# Patient Record
Sex: Male | Born: 1990 | Race: Black or African American | Hispanic: No | Marital: Married
Health system: Southern US, Community
[De-identification: ages and names within clinical notes are randomized; demographics above are authoritative.]

---

## 2016-08-09 ENCOUNTER — Inpatient Hospital Stay (HOSPITAL_COMMUNITY)
Admission: EM | Admit: 2016-08-09 | Discharge: 2016-08-13 | DRG: 083 | Disposition: A | Discharge status: Rehabilitation Facility | Payer: No Typology Code available for payment source | Attending: General Surgery | Admitting: General Surgery

## 2016-08-09 ENCOUNTER — Encounter (HOSPITAL_COMMUNITY): Payer: Self-pay | Admitting: Radiology

## 2016-08-09 ENCOUNTER — Emergency Department (HOSPITAL_COMMUNITY): Payer: No Typology Code available for payment source

## 2016-08-09 DIAGNOSIS — S069X9A Unspecified intracranial injury with loss of consciousness of unspecified duration, initial encounter: Secondary | ICD-10-CM | POA: Diagnosis present

## 2016-08-09 DIAGNOSIS — Z781 Physical restraint status: Secondary | ICD-10-CM | POA: Diagnosis not present

## 2016-08-09 DIAGNOSIS — S51012A Laceration without foreign body of left elbow, initial encounter: Secondary | ICD-10-CM | POA: Diagnosis present

## 2016-08-09 DIAGNOSIS — I629 Nontraumatic intracranial hemorrhage, unspecified: Secondary | ICD-10-CM

## 2016-08-09 DIAGNOSIS — J9600 Acute respiratory failure, unspecified whether with hypoxia or hypercapnia: Secondary | ICD-10-CM | POA: Diagnosis present

## 2016-08-09 DIAGNOSIS — R45851 Suicidal ideations: Secondary | ICD-10-CM

## 2016-08-09 DIAGNOSIS — R4689 Other symptoms and signs involving appearance and behavior: Secondary | ICD-10-CM

## 2016-08-09 DIAGNOSIS — N179 Acute kidney failure, unspecified: Secondary | ICD-10-CM | POA: Diagnosis present

## 2016-08-09 DIAGNOSIS — Z23 Encounter for immunization: Secondary | ICD-10-CM

## 2016-08-09 DIAGNOSIS — R451 Restlessness and agitation: Secondary | ICD-10-CM | POA: Diagnosis present

## 2016-08-09 DIAGNOSIS — S069XAA Unspecified intracranial injury with loss of consciousness status unknown, initial encounter: Secondary | ICD-10-CM

## 2016-08-09 DIAGNOSIS — S062X9A Diffuse traumatic brain injury with loss of consciousness of unspecified duration, initial encounter: Secondary | ICD-10-CM | POA: Diagnosis present

## 2016-08-09 LAB — URINALYSIS, ROUTINE W REFLEX MICROSCOPIC
Bilirubin Urine: NEGATIVE
GLUCOSE, UA: NEGATIVE mg/dL
Hgb urine dipstick: NEGATIVE
KETONES UR: NEGATIVE mg/dL
LEUKOCYTES UA: NEGATIVE
NITRITE: NEGATIVE
PH: 6 (ref 5.0–8.0)
Protein, ur: NEGATIVE mg/dL
SPECIFIC GRAVITY, URINE: 1.015 (ref 1.005–1.030)

## 2016-08-09 LAB — TYPE AND SCREEN
Blood Product Expiration Date: 201801112359
Blood Product Expiration Date: 201801122359
ISSUE DATE / TIME: 201801081149
ISSUE DATE / TIME: 201801081149
UNIT TYPE AND RH: 9500
Unit Type and Rh: 9500

## 2016-08-09 LAB — I-STAT ARTERIAL BLOOD GAS, ED
ACID-BASE DEFICIT: 3 mmol/L — AB (ref 0.0–2.0)
BICARBONATE: 23.9 mmol/L (ref 20.0–28.0)
O2 SAT: 100 %
PCO2 ART: 47.7 mmHg (ref 32.0–48.0)
Patient temperature: 98.7
TCO2: 25 mmol/L (ref 0–100)
pH, Arterial: 7.309 — ABNORMAL LOW (ref 7.350–7.450)
pO2, Arterial: 498 mmHg — ABNORMAL HIGH (ref 83.0–108.0)

## 2016-08-09 LAB — PREPARE FRESH FROZEN PLASMA
UNIT DIVISION: 0
Unit division: 0

## 2016-08-09 LAB — CBC
HEMATOCRIT: 44.5 % (ref 39.0–52.0)
Hemoglobin: 14.9 g/dL (ref 13.0–17.0)
MCH: 24.5 pg — AB (ref 26.0–34.0)
MCHC: 33.5 g/dL (ref 30.0–36.0)
MCV: 73.2 fL — AB (ref 78.0–100.0)
Platelets: 323 10*3/uL (ref 150–400)
RBC: 6.08 MIL/uL — AB (ref 4.22–5.81)
RDW: 14.5 % (ref 11.5–15.5)
WBC: 20.4 10*3/uL — AB (ref 4.0–10.5)

## 2016-08-09 LAB — COMPREHENSIVE METABOLIC PANEL
ALT: 24 U/L (ref 17–63)
AST: 51 U/L — ABNORMAL HIGH (ref 15–41)
Albumin: 4.4 g/dL (ref 3.5–5.0)
Alkaline Phosphatase: 74 U/L (ref 38–126)
Anion gap: 17 — ABNORMAL HIGH (ref 5–15)
BUN: <5 mg/dL — ABNORMAL LOW (ref 6–20)
CHLORIDE: 105 mmol/L (ref 101–111)
CO2: 21 mmol/L — AB (ref 22–32)
CREATININE: 1.33 mg/dL — AB (ref 0.61–1.24)
Calcium: 9.8 mg/dL (ref 8.9–10.3)
Glucose, Bld: 173 mg/dL — ABNORMAL HIGH (ref 65–99)
POTASSIUM: 4 mmol/L (ref 3.5–5.1)
SODIUM: 143 mmol/L (ref 135–145)
Total Bilirubin: 1 mg/dL (ref 0.3–1.2)
Total Protein: 7.9 g/dL (ref 6.5–8.1)

## 2016-08-09 LAB — I-STAT CG4 LACTIC ACID, ED: Lactic Acid, Venous: 11.55 mmol/L (ref 0.5–1.9)

## 2016-08-09 LAB — I-STAT CHEM 8, ED
BUN: 5 mg/dL — ABNORMAL LOW (ref 6–20)
CREATININE: 1.1 mg/dL (ref 0.61–1.24)
Calcium, Ion: 1.17 mmol/L (ref 1.15–1.40)
Chloride: 103 mmol/L (ref 101–111)
GLUCOSE: 168 mg/dL — AB (ref 65–99)
HEMATOCRIT: 49 % (ref 39.0–52.0)
HEMOGLOBIN: 16.7 g/dL (ref 13.0–17.0)
POTASSIUM: 4.3 mmol/L (ref 3.5–5.1)
Sodium: 143 mmol/L (ref 135–145)
TCO2: 25 mmol/L (ref 0–100)

## 2016-08-09 LAB — PROTIME-INR
INR: 1.07
PROTHROMBIN TIME: 14 s (ref 11.4–15.2)

## 2016-08-09 LAB — ABO/RH: ABO/RH(D): B NEG

## 2016-08-09 LAB — CDS SEROLOGY

## 2016-08-09 LAB — ETHANOL: Alcohol, Ethyl (B): <5 mg/dL (ref <5)

## 2016-08-09 MED ORDER — POTASSIUM CHLORIDE IN NACL 20-0.9 MEQ/L-% IV SOLN
INTRAVENOUS | Status: DC
  Administered 2016-08-09 – 2016-08-12 (×4): via INTRAVENOUS
  Filled 2016-08-09 (×6): qty 1000

## 2016-08-09 MED ORDER — PROPOFOL 1000 MG/100ML IV EMUL
INTRAVENOUS | Status: AC
  Administered 2016-08-09: 70 ug/kg/min
  Administered 2016-08-09: 20:00:00
  Filled 2016-08-09: qty 100

## 2016-08-09 MED ORDER — FENTANYL BOLUS VIA INFUSION
50.0000 ug | INTRAVENOUS | Status: DC | PRN
  Filled 2016-08-09: qty 50

## 2016-08-09 MED ORDER — BACITRACIN ZINC 500 UNIT/GM EX OINT
TOPICAL_OINTMENT | Freq: Two times a day (BID) | CUTANEOUS | Status: DC
  Administered 2016-08-09 – 2016-08-13 (×8): via TOPICAL
  Filled 2016-08-09: qty 28.35

## 2016-08-09 MED ORDER — SODIUM CHLORIDE 0.9 % IV SOLN
INTRAVENOUS | Status: AC | PRN
  Administered 2016-08-09: 1000 mL via INTRAVENOUS

## 2016-08-09 MED ORDER — SODIUM CHLORIDE 0.9 % IV SOLN
25.0000 ug/h | INTRAVENOUS | Status: DC
  Administered 2016-08-09: 100 ug/h via INTRAVENOUS
  Filled 2016-08-09: qty 50

## 2016-08-09 MED ORDER — CHLORHEXIDINE GLUCONATE 0.12% ORAL RINSE (MEDLINE KIT)
15.0000 mL | Freq: Two times a day (BID) | OROMUCOSAL | Status: DC
  Administered 2016-08-09 – 2016-08-12 (×6): 15 mL via OROMUCOSAL

## 2016-08-09 MED ORDER — ONDANSETRON HCL 4 MG/2ML IJ SOLN
4.0000 mg | Freq: Four times a day (QID) | INTRAMUSCULAR | Status: DC | PRN
  Administered 2016-08-10 – 2016-08-12 (×3): 4 mg via INTRAVENOUS
  Filled 2016-08-09 (×4): qty 2

## 2016-08-09 MED ORDER — HALOPERIDOL LACTATE 5 MG/ML IJ SOLN
5.0000 mg | Freq: Once | INTRAMUSCULAR | Status: AC
  Administered 2016-08-09: 5 mg via INTRAVENOUS

## 2016-08-09 MED ORDER — MIDAZOLAM HCL 2 MG/2ML IJ SOLN
2.0000 mg | INTRAMUSCULAR | Status: DC | PRN
Start: 2016-08-09 — End: 2016-08-13
  Administered 2016-08-10 – 2016-08-12 (×9): 2 mg via INTRAVENOUS
  Filled 2016-08-09 (×8): qty 2

## 2016-08-09 MED ORDER — ORAL CARE MOUTH RINSE
15.0000 mL | Freq: Four times a day (QID) | OROMUCOSAL | Status: DC
  Administered 2016-08-09 – 2016-08-12 (×8): 15 mL via OROMUCOSAL

## 2016-08-09 MED ORDER — LIDOCAINE-EPINEPHRINE (PF) 2 %-1:200000 IJ SOLN
10.0000 mL | Freq: Once | INTRAMUSCULAR | Status: AC
  Administered 2016-08-09: 10 mL via INTRADERMAL
  Filled 2016-08-09: qty 20

## 2016-08-09 MED ORDER — PROPOFOL 1000 MG/100ML IV EMUL
INTRAVENOUS | Status: AC
  Filled 2016-08-09: qty 100

## 2016-08-09 MED ORDER — FENTANYL CITRATE (PF) 100 MCG/2ML IJ SOLN
50.0000 ug | Freq: Once | INTRAMUSCULAR | Status: AC
  Administered 2016-08-09: 50 ug via INTRAVENOUS

## 2016-08-09 MED ORDER — PANTOPRAZOLE SODIUM 40 MG PO TBEC
40.0000 mg | DELAYED_RELEASE_TABLET | Freq: Every day | ORAL | Status: DC
  Administered 2016-08-13: 40 mg via ORAL
  Filled 2016-08-09: qty 1

## 2016-08-09 MED ORDER — PANTOPRAZOLE SODIUM 40 MG IV SOLR
40.0000 mg | Freq: Every day | INTRAVENOUS | Status: DC
  Administered 2016-08-09 – 2016-08-12 (×4): 40 mg via INTRAVENOUS
  Filled 2016-08-09 (×4): qty 40

## 2016-08-09 MED ORDER — DOCUSATE SODIUM 100 MG PO CAPS
100.0000 mg | ORAL_CAPSULE | Freq: Two times a day (BID) | ORAL | Status: DC
  Administered 2016-08-12: 100 mg via ORAL
  Filled 2016-08-09: qty 1

## 2016-08-09 MED ORDER — ETOMIDATE 2 MG/ML IV SOLN
INTRAVENOUS | Status: AC | PRN
  Administered 2016-08-09: 20 mg via INTRAVENOUS

## 2016-08-09 MED ORDER — FENTANYL CITRATE (PF) 100 MCG/2ML IJ SOLN
INTRAMUSCULAR | Status: AC
  Filled 2016-08-09: qty 2

## 2016-08-09 MED ORDER — PROPOFOL 1000 MG/100ML IV EMUL
5.0000 ug/kg/min | INTRAVENOUS | Status: DC
  Administered 2016-08-09: 50 ug/kg/min via INTRAVENOUS
  Administered 2016-08-10 (×2): 45 ug/kg/min via INTRAVENOUS
  Filled 2016-08-09 (×2): qty 100
  Filled 2016-08-09: qty 200

## 2016-08-09 MED ORDER — ROCURONIUM BROMIDE 50 MG/5ML IV SOLN
INTRAVENOUS | Status: AC | PRN
  Administered 2016-08-09: 50 mg via INTRAVENOUS

## 2016-08-09 MED ORDER — ONDANSETRON HCL 4 MG PO TABS: 4.0000 mg | ORAL_TABLET | Freq: Four times a day (QID) | ORAL | Status: DC | PRN

## 2016-08-09 MED ORDER — SUCCINYLCHOLINE CHLORIDE 20 MG/ML IJ SOLN
INTRAMUSCULAR | Status: AC | PRN
  Administered 2016-08-09: 100 mg via INTRAVENOUS

## 2016-08-09 MED ORDER — MIDAZOLAM HCL 2 MG/2ML IJ SOLN
2.0000 mg | INTRAMUSCULAR | Status: DC | PRN
  Administered 2016-08-09: 2 mg via INTRAVENOUS
  Filled 2016-08-09 (×2): qty 2

## 2016-08-09 MED ORDER — PROPOFOL 1000 MG/100ML IV EMUL: INTRAVENOUS | Status: AC | PRN

## 2016-08-09 MED ORDER — IOPAMIDOL (ISOVUE-300) INJECTION 61%
100.0000 mL | Freq: Once | INTRAVENOUS | Status: AC | PRN
Start: 2016-08-09 — End: 2016-08-09
  Administered 2016-08-09: 100 mL via INTRAVENOUS

## 2016-08-09 MED ORDER — LORAZEPAM 2 MG/ML IJ SOLN
INTRAMUSCULAR | Status: AC | PRN
  Administered 2016-08-09: 2 mg via INTRAVENOUS

## 2016-08-09 NOTE — ED Notes (Signed)
Pt remains sedated.  No change in mental status, skin warm and dry, color appropriate

## 2016-08-09 NOTE — ED Notes (Signed)
Family remains at bedside.  Skin warm and dry, color appropriate,.  Pt remains sedated,.

## 2016-08-09 NOTE — ED Notes (Signed)
Family updated as to patient's status.

## 2016-08-09 NOTE — ED Triage Notes (Signed)
See trauma notes

## 2016-08-09 NOTE — Procedures (Signed)
Procedure: Laceration repair left elbow ~3cm  Indication: Laceration left elbow  Surgeon: Gerald DexterLogan Scherer, PA-S  Assist: Charma IgoMichael England Greb, PA-C  Anesthesia: Lidocaine 2% w/epinephrine  EBL: None  Complications: None  Findings: Consent was unobtainable as pt was intubated and family was not present. The area was infiltrated with ~125ml of anesthetic then several 4-0 nylon interrupted sutures were used to approximate the wound. There were irregular wound edges and some devitalized tissue which were sharply debrided. Closure was acceptable given the degree of tissue loss.    Freeman CaldronMichael J. Numair Masden, PA-C Pager: [phone] General Trauma PA Pager: [phone]

## 2016-08-09 NOTE — ED Notes (Signed)
Family remains at bedside.  Pt remains sedated.  Vital signs stable, skin warm and dry.

## 2016-08-09 NOTE — ED Notes (Signed)
Pt transported to CT with RN
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
## 2016-08-09 NOTE — Progress Notes (Signed)
RT transported patient from ED to 3M without any complications.  
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
## 2016-08-09 NOTE — ED Notes (Signed)
New bottle Propofol started at 6170mcg/kg/min

## 2016-08-09 NOTE — ED Notes (Signed)
Pt's father remains at bedside.  Father's cell number [phone]
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
## 2016-08-09 NOTE — ED Notes (Signed)
Pt returned from CT °

## 2016-08-09 NOTE — ED Notes (Signed)
Family at bedside at this time

## 2016-08-09 NOTE — ED Notes (Signed)
Pt remains the same at this time.

## 2016-08-09 NOTE — ED Notes (Signed)
Condom cath applied to pt. 

## 2016-08-10 ENCOUNTER — Inpatient Hospital Stay (HOSPITAL_COMMUNITY): Payer: No Typology Code available for payment source

## 2016-08-10 LAB — BASIC METABOLIC PANEL
ANION GAP: 7 (ref 5–15)
BUN: 5 mg/dL — AB (ref 6–20)
CHLORIDE: 111 mmol/L (ref 101–111)
CO2: 24 mmol/L (ref 22–32)
Calcium: 9 mg/dL (ref 8.9–10.3)
Creatinine, Ser: 1.19 mg/dL (ref 0.61–1.24)
GFR calc Af Amer: >60 mL/min (ref >60)
GLUCOSE: 96 mg/dL (ref 65–99)
POTASSIUM: 3.3 mmol/L — AB (ref 3.5–5.1)
Sodium: 142 mmol/L (ref 135–145)

## 2016-08-10 LAB — CBC
HEMATOCRIT: 38.9 % — AB (ref 39.0–52.0)
Hemoglobin: 12.9 g/dL — ABNORMAL LOW (ref 13.0–17.0)
MCH: 23.8 pg — AB (ref 26.0–34.0)
MCHC: 33.2 g/dL (ref 30.0–36.0)
MCV: 71.9 fL — ABNORMAL LOW (ref 78.0–100.0)
Platelets: 239 10*3/uL (ref 150–400)
RBC: 5.41 MIL/uL (ref 4.22–5.81)
RDW: 14.5 % (ref 11.5–15.5)
WBC: 12.9 10*3/uL — ABNORMAL HIGH (ref 4.0–10.5)

## 2016-08-10 LAB — MRSA PCR SCREENING: MRSA by PCR: NEGATIVE

## 2016-08-10 LAB — TRIGLYCERIDES: Triglycerides: 115 mg/dL (ref <150)

## 2016-08-10 MED ORDER — PNEUMOCOCCAL VAC POLYVALENT 25 MCG/0.5ML IJ INJ
0.5000 mL | INJECTION | INTRAMUSCULAR | Status: AC
  Administered 2016-08-11: 0.5 mL via INTRAMUSCULAR
  Filled 2016-08-10: qty 0.5

## 2016-08-10 MED ORDER — MIDAZOLAM HCL 2 MG/2ML IJ SOLN: 1.0000 mg | INTRAMUSCULAR | Status: DC | PRN

## 2016-08-10 MED ORDER — LORAZEPAM 2 MG/ML IJ SOLN
1.0000 mg | INTRAMUSCULAR | Status: DC | PRN
  Administered 2016-08-10 – 2016-08-11 (×5): 2 mg via INTRAVENOUS
  Filled 2016-08-10 (×6): qty 1

## 2016-08-10 MED ORDER — LORAZEPAM 2 MG/ML IJ SOLN
INTRAMUSCULAR | Status: AC
  Administered 2016-08-10: 2 mg
  Filled 2016-08-10: qty 1

## 2016-08-10 MED ORDER — DEXMEDETOMIDINE HCL IN NACL 200 MCG/50ML IV SOLN
0.4000 ug/kg/h | INTRAVENOUS | Status: DC
  Administered 2016-08-10: 1.1 ug/kg/h via INTRAVENOUS
  Administered 2016-08-10: 1.2 ug/kg/h via INTRAVENOUS
  Administered 2016-08-10: 0.4 ug/kg/h via INTRAVENOUS
  Administered 2016-08-10: 1.2 ug/kg/h via INTRAVENOUS
  Administered 2016-08-11: 0.6 ug/kg/h via INTRAVENOUS
  Administered 2016-08-11 (×2): 0.8 ug/kg/h via INTRAVENOUS
  Administered 2016-08-11: 0.7 ug/kg/h via INTRAVENOUS
  Administered 2016-08-11: 0.5 ug/kg/h via INTRAVENOUS
  Administered 2016-08-12: 0.6 ug/kg/h via INTRAVENOUS
  Filled 2016-08-10 (×7): qty 50
  Filled 2016-08-10: qty 100

## 2016-08-10 MED ORDER — INFLUENZA VAC SPLIT QUAD 0.5 ML IM SUSY
0.5000 mL | PREFILLED_SYRINGE | INTRAMUSCULAR | Status: AC
Start: 2016-08-11 — End: 2016-08-13
  Administered 2016-08-13: 0.5 mL via INTRAMUSCULAR
  Filled 2016-08-10 (×2): qty 0.5

## 2016-08-10 MED ORDER — HALOPERIDOL LACTATE 5 MG/ML IJ SOLN
5.0000 mg | Freq: Four times a day (QID) | INTRAMUSCULAR | Status: DC | PRN
  Administered 2016-08-11: 5 mg via INTRAVENOUS
  Filled 2016-08-10: qty 1

## 2016-08-10 MED ORDER — SODIUM CHLORIDE 0.9 % IV SOLN
30.0000 meq | Freq: Once | INTRAVENOUS | Status: AC
  Administered 2016-08-10: 30 meq via INTRAVENOUS
  Filled 2016-08-10: qty 15
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
## 2016-08-10 NOTE — Progress Notes (Signed)
Patient ID: Luke Evans, male   DOB: 10/22/1990, 26 y.o.   MRN: [id number] Extubated. Sats OK but agitated. WIll try Precedex. Family updated at bedside. [name], MD, MPH, FACS Trauma: [phone] General Surgery: [phone]

## 2016-08-10 NOTE — Procedures (Signed)
Extubation Procedure Note  Patient Details:   Name: Luke Evans DOB: 02/04/1991 MRN: [id number]   Airway Documentation:     Evaluation  O2 sats: stable throughout Complications: No apparent complications Patient did tolerate procedure well. Bilateral Breath Sounds: Clear   Yes  Patient very anxious. Pulling good volumes. MD at bedside. Order to extubate. Positive for cuff leak. Patient extubated to room air as he wouldn't let the nasal cannula be put on. He became very frustrated. No signs of dyspnea or stridor. RN at bedside. Will continue to monitor.  Luke Evans, Luke Evans 08/10/2016, 3:51 PM
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
## 2016-08-11 DIAGNOSIS — T1491XA Suicide attempt, initial encounter: Secondary | ICD-10-CM

## 2016-08-11 DIAGNOSIS — S069X0A Unspecified intracranial injury without loss of consciousness, initial encounter: Secondary | ICD-10-CM

## 2016-08-11 DIAGNOSIS — R451 Restlessness and agitation: Secondary | ICD-10-CM

## 2016-08-11 DIAGNOSIS — X820XXA Intentional collision of motor vehicle with other motor vehicle, initial encounter: Secondary | ICD-10-CM

## 2016-08-11 DIAGNOSIS — Z79899 Other long term (current) drug therapy: Secondary | ICD-10-CM

## 2016-08-11 DIAGNOSIS — Z630 Problems in relationship with spouse or partner: Secondary | ICD-10-CM

## 2016-08-11 DIAGNOSIS — F1910 Other psychoactive substance abuse, uncomplicated: Secondary | ICD-10-CM

## 2016-08-11 LAB — CBC
HEMATOCRIT: 38.7 % — AB (ref 39.0–52.0)
HEMOGLOBIN: 12.7 g/dL — AB (ref 13.0–17.0)
MCH: 23.9 pg — AB (ref 26.0–34.0)
MCHC: 32.8 g/dL (ref 30.0–36.0)
MCV: 72.9 fL — AB (ref 78.0–100.0)
Platelets: 214 10*3/uL (ref 150–400)
RBC: 5.31 MIL/uL (ref 4.22–5.81)
RDW: 14.5 % (ref 11.5–15.5)
WBC: 13.1 10*3/uL — ABNORMAL HIGH (ref 4.0–10.5)

## 2016-08-11 LAB — BASIC METABOLIC PANEL
Anion gap: 8 (ref 5–15)
BUN: 7 mg/dL (ref 6–20)
CHLORIDE: 110 mmol/L (ref 101–111)
CO2: 22 mmol/L (ref 22–32)
Calcium: 9 mg/dL (ref 8.9–10.3)
Creatinine, Ser: 0.91 mg/dL (ref 0.61–1.24)
GFR calc Af Amer: >60 mL/min (ref >60)
GFR calc non Af Amer: >60 mL/min (ref >60)
GLUCOSE: 109 mg/dL — AB (ref 65–99)
POTASSIUM: 3.7 mmol/L (ref 3.5–5.1)
Sodium: 140 mmol/L (ref 135–145)

## 2016-08-11 LAB — BLOOD PRODUCT ORDER (VERBAL) VERIFICATION

## 2016-08-11 MED ORDER — HYDRALAZINE HCL 20 MG/ML IJ SOLN: 10.0000 mg | INTRAMUSCULAR | Status: DC | PRN
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
## 2016-08-11 NOTE — Progress Notes (Signed)
Inpatient Rehabilitation  PT/OT/SLP have completed their evaluations and are recommending CIR. Patient was screened by Weldon PickingSusan Anay Walter for appropriateness for an Inpatient Acute Rehab consult.  At this time, we are recommending Inpatient Rehab consult.  Please order consult if you are agreeable.  Weldon PickingSusan Linden Mikes PT Inpatient Rehab Admissions Coordinator Cell [phone] Office [phone]
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
## 2016-08-12 DIAGNOSIS — S069X3S Unspecified intracranial injury with loss of consciousness of 1 hour to 5 hours 59 minutes, sequela: Secondary | ICD-10-CM

## 2016-08-12 LAB — CBC
HCT: 35.9 % — ABNORMAL LOW (ref 39.0–52.0)
HEMOGLOBIN: 11.5 g/dL — AB (ref 13.0–17.0)
MCH: 23 pg — ABNORMAL LOW (ref 26.0–34.0)
MCHC: 32 g/dL (ref 30.0–36.0)
MCV: 71.7 fL — ABNORMAL LOW (ref 78.0–100.0)
Platelets: 203 10*3/uL (ref 150–400)
RBC: 5.01 MIL/uL (ref 4.22–5.81)
RDW: 14 % (ref 11.5–15.5)
WBC: 10.3 10*3/uL (ref 4.0–10.5)

## 2016-08-12 LAB — BASIC METABOLIC PANEL
ANION GAP: 7 (ref 5–15)
BUN: <5 mg/dL — ABNORMAL LOW (ref 6–20)
CALCIUM: 8.9 mg/dL (ref 8.9–10.3)
CO2: 24 mmol/L (ref 22–32)
Chloride: 108 mmol/L (ref 101–111)
Creatinine, Ser: 0.83 mg/dL (ref 0.61–1.24)
GFR calc non Af Amer: >60 mL/min (ref >60)
Glucose, Bld: 99 mg/dL (ref 65–99)
Potassium: 3.3 mmol/L — ABNORMAL LOW (ref 3.5–5.1)
SODIUM: 139 mmol/L (ref 135–145)

## 2016-08-12 LAB — TRIGLYCERIDES: TRIGLYCERIDES: 90 mg/dL (ref <150)

## 2016-08-12 MED ORDER — OXYCODONE HCL 5 MG PO TABS
5.0000 mg | ORAL_TABLET | ORAL | Status: DC | PRN
  Administered 2016-08-12: 5 mg via ORAL
  Administered 2016-08-13 (×2): 10 mg via ORAL
  Filled 2016-08-12 (×2): qty 2
  Filled 2016-08-12: qty 1

## 2016-08-12 MED ORDER — WHITE PETROLATUM GEL
Status: AC
  Administered 2016-08-12: 04:00:00
  Filled 2016-08-12: qty 1

## 2016-08-12 MED ORDER — POTASSIUM CHLORIDE CRYS ER 20 MEQ PO TBCR
20.0000 meq | EXTENDED_RELEASE_TABLET | Freq: Two times a day (BID) | ORAL | Status: AC
  Administered 2016-08-12 (×2): 20 meq via ORAL
  Filled 2016-08-12 (×2): qty 1

## 2016-08-12 NOTE — Discharge Instructions (Signed)
Intracranial Hemorrhage An intracranial hemorrhage is bleeding in the layers between the skull (cranium) and brain. A blood vessel bursts and allows blood to leak inside the cranial cavity. The leaking blood then collects (hematoma). This causes pressure and damage to brain cells. The bleeding can be mild to severe. In severe cases, it can lead to permanent damage or death. Symptoms may come on suddenly or develop over time. Early diagnosis and treatment leads to better recovery. There are four types of intracranial hemorrhage: subarachnoid, subdural, extradural, or cerebral hemorrhage. What are the causes?  Head injury (trauma).  Ruptured brain aneurysm.  Bleeding from blood vessels that develop abnormally (arteriovenous malformation).  Bleeding disorder.  Use of blood thinners (anticoagulants).  Use of certain drugs, such as cocaine. For some people with intracranial hemorrhage, the cause is unknown. What increases the risk?  Using tobacco products, such as cigarettes and chewing tobacco.  Having high blood pressure (hypertension).  Abusing alcohol.  Being a male, especially of postmenopausal age.  Having a family history of disease in the blood vessels of the brain (cerebrovascular disease).  Having certain genetic syndromes that result in kidney disease or connective tissue disease. What are the signs or symptoms?  A sudden, severe headache with no known cause. The headache is often described as the worst headache ever experienced.  Nausea or vomiting, especially when combined with other symptoms such as a headache.  Sudden weakness or numbness of the face, arm, or leg, especially on one side of the body.  Sudden trouble walking or difficulty moving arms or legs.  Sudden confusion.  Sudden personality changes.  Trouble speaking (aphasia) or understanding.  Difficulty swallowing.  Sudden trouble seeing in one or both eyes.  Double  vision.  Dizziness.  Loss of balance or coordination.  Intolerance to light.  Stiff neck. How is this diagnosed? Your health care provider will perform a physical exam and ask about your symptoms. If an intracranial hemorrhage is suspected, various tests may be ordered. These tests may include:  A CT scan.  An MRI.  A cerebral angiogram.  A spinal tap (lumbar puncture).  Blood tests. How is this treated? Immediate treatment in the hospital is often required to reduce the risk of brain damage. Treatment will depend on the cause of the bleeding, where it is located, and the extent of the bleeding and damage. The goals of treatment include stopping the bleeding, repairing the cause of bleeding, providing relief of symptoms, and preventing problems.  Medicines may be given to:  Lower blood pressure (antihypertensives).  Relieve pain (analgesics).  Relieve nausea or vomiting.  Surgery may be needed to stop the bleeding, repair the cause of the bleeding, or remove the blood.  Rehabilitation may be needed to improve any cognitive and day-to-day functions impaired by the condition. Further treatment depends on the duration, severity, and cause of your symptoms. Physical, speech, and occupational therapists will assess you and work to improve any functions impaired by the intracranial hemorrhage. Measures will be taken to prevent short-term and long-term problems, including infection from breathing foreign material into the lungs (aspiration pneumonia), blood clots in the legs, bedsores, and falls. Follow these instructions at home:  Take medicines only as directed by your health care provider.  Eat healthy foods as directed by your health care provider:  A diet low in salt (sodium), saturated fat, trans fat, and cholesterol may be recommended to manage your blood pressure.  Foods may need to be soft or pureed, or  small bites may need to be taken in order to avoid aspirating or  choking.  If studies show that your ability to swallow safely has been affected, you may need to seek help from specialists such as a dietitian, speech and language pathologist, or an occupational therapist. These health care providers can teach you how to safely get the nutrition your body needs.  Rest and limit activities or movements as directed by your health care provider.  Do not use any tobacco products including cigarettes, chewing tobacco, or electronic cigarettes. If you need help quitting, ask your health care provider.  Limit alcohol intake to no more than 1 drink per day for nonpregnant women and 2 drinks per day for men. One drink equals 12 ounces of beer, 5 ounces of wine, or 1 ounces of hard liquor.  Make any other lifestyle changes as directed by your health care provider.  Monitor and record your blood pressure as directed by your health care provider.  A safe home environment is important to reduce the risk of falls. Your health care provider may arrange for specialists to evaluate your home. Having grab bars in the bedroom and bathroom is often important. Your health care provider may arrange for special equipment to be used at home, such as raised toilets and a seat for the shower.  Do physical, occupational, and speech therapy as directed by your health care provider. Ongoing therapy may be needed to maximize your recovery.  Use a walker or a cane at all times if directed by your health care provider.  Keep all follow-up visits with your health care provider and other specialists. This is important. This includes any referrals, physical therapy, and rehabilitation. Get help right away if:  You have a sudden, severe headache with no known cause.  You have nausea or vomiting occurring with another symptom.  You have sudden weakness or numbness of the face, arm, or leg, especially on one side of the body.  You have sudden trouble walking or difficulty moving your arms  or legs.  You have sudden confusion.  You have trouble speaking (aphasia) or understanding.  You have sudden trouble seeing in one or both eyes.  You have a sudden loss of balance or coordination.  You have a stiff neck.  You have difficulty breathing.  You have a partial or total loss of consciousness. These symptoms may represent a serious problem that is an emergency. Do not wait to see if the symptoms will go away. Get medical help right away. Call your local emergency services (911 in the [place] Do not drive yourself to the hospital.  This information is not intended to replace advice given to you by your health care provider. Make sure you discuss any questions you have with your health care provider. Document Released: 02/13/2014 Document Revised: 12/19/2015 Document Reviewed: 09/12/2013 Elsevier Interactive Patient Education  2017 [employer] Intracranial Hemorrhage An intracranial hemorrhage is bleeding in the layers between the skull (cranium) and brain. A blood vessel bursts and allows blood to leak inside the cranial cavity. The leaking blood then collects (hematoma). This causes pressure and damage to brain cells. The bleeding can be mild to severe. In severe cases, it can lead to permanent damage or death. Symptoms may come on suddenly or develop over time. Early diagnosis and treatment leads to better recovery. There are four types of intracranial hemorrhage: subarachnoid, subdural, extradural, or cerebral hemorrhage. What are the causes?  Head injury (trauma).  Ruptured brain aneurysm.  Bleeding from blood vessels that develop abnormally (arteriovenous malformation).  Bleeding disorder.  Use of blood thinners (anticoagulants).  Use of certain drugs, such as cocaine. For some people with intracranial hemorrhage, the cause is unknown. What increases the risk?  Using tobacco products, such as cigarettes and chewing tobacco.  Having high blood pressure  (hypertension).  Abusing alcohol.  Being a male, especially of postmenopausal age.  Having a family history of disease in the blood vessels of the brain (cerebrovascular disease).  Having certain genetic syndromes that result in kidney disease or connective tissue disease. What are the signs or symptoms?  A sudden, severe headache with no known cause. The headache is often described as the worst headache ever experienced.  Nausea or vomiting, especially when combined with other symptoms such as a headache.  Sudden weakness or numbness of the face, arm, or leg, especially on one side of the body.  Sudden trouble walking or difficulty moving arms or legs.  Sudden confusion.  Sudden personality changes.  Trouble speaking (aphasia) or understanding.  Difficulty swallowing.  Sudden trouble seeing in one or both eyes.  Double vision.  Dizziness.  Loss of balance or coordination.  Intolerance to light.  Stiff neck. How is this diagnosed? Your health care provider will perform a physical exam and ask about your symptoms. If an intracranial hemorrhage is suspected, various tests may be ordered. These tests may include:  A CT scan.  An MRI.  A cerebral angiogram.  A spinal tap (lumbar puncture).  Blood tests. How is this treated? Immediate treatment in the hospital is often required to reduce the risk of brain damage. Treatment will depend on the cause of the bleeding, where it is located, and the extent of the bleeding and damage. The goals of treatment include stopping the bleeding, repairing the cause of bleeding, providing relief of symptoms, and preventing problems.  Medicines may be given to:  Lower blood pressure (antihypertensives).  Relieve pain (analgesics).  Relieve nausea or vomiting.  Surgery may be needed to stop the bleeding, repair the cause of the bleeding, or remove the blood.  Rehabilitation may be needed to improve any cognitive and  day-to-day functions impaired by the condition. Further treatment depends on the duration, severity, and cause of your symptoms. Physical, speech, and occupational therapists will assess you and work to improve any functions impaired by the intracranial hemorrhage. Measures will be taken to prevent short-term and long-term problems, including infection from breathing foreign material into the lungs (aspiration pneumonia), blood clots in the legs, bedsores, and falls. Follow these instructions at home:  Take medicines only as directed by your health care provider.  Eat healthy foods as directed by your health care provider:  A diet low in salt (sodium), saturated fat, trans fat, and cholesterol may be recommended to manage your blood pressure.  Foods may need to be soft or pureed, or small bites may need to be taken in order to avoid aspirating or choking.  If studies show that your ability to swallow safely has been affected, you may need to seek help from specialists such as a dietitian, speech and language pathologist, or an occupational therapist. These health care providers can teach you how to safely get the nutrition your body needs.  Rest and limit activities or movements as directed by your health care provider.  Do not use any tobacco products including cigarettes, chewing tobacco, or electronic cigarettes. If you need help quitting, ask your health care provider.  Limit alcohol  intake to no more than 1 drink per day for nonpregnant women and 2 drinks per day for men. One drink equals 12 ounces of beer, 5 ounces of wine, or 1 ounces of hard liquor.  Make any other lifestyle changes as directed by your health care provider.  Monitor and record your blood pressure as directed by your health care provider.  A safe home environment is important to reduce the risk of falls. Your health care provider may arrange for specialists to evaluate your home. Having grab bars in the bedroom and  bathroom is often important. Your health care provider may arrange for special equipment to be used at home, such as raised toilets and a seat for the shower.  Do physical, occupational, and speech therapy as directed by your health care provider. Ongoing therapy may be needed to maximize your recovery.  Use a walker or a cane at all times if directed by your health care provider.  Keep all follow-up visits with your health care provider and other specialists. This is important. This includes any referrals, physical therapy, and rehabilitation. Get help right away if:  You have a sudden, severe headache with no known cause.  You have nausea or vomiting occurring with another symptom.  You have sudden weakness or numbness of the face, arm, or leg, especially on one side of the body.  You have sudden trouble walking or difficulty moving your arms or legs.  You have sudden confusion.  You have trouble speaking (aphasia) or understanding.  You have sudden trouble seeing in one or both eyes.  You have a sudden loss of balance or coordination.  You have a stiff neck.  You have difficulty breathing.  You have a partial or total loss of consciousness. These symptoms may represent a serious problem that is an emergency. Do not wait to see if the symptoms will go away. Get medical help right away. Call your local emergency services (911 in the [place] Do not drive yourself to the hospital.  This information is not intended to replace advice given to you by your health care provider. Make sure you discuss any questions you have with your health care provider. Document Released: 02/13/2014 Document Revised: 12/19/2015 Document Reviewed: 09/12/2013 Elsevier Interactive Patient Education  2017 [employer]
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
## 2016-08-12 NOTE — Progress Notes (Signed)
Rehab admissions - I met with mom/family at the bedside while patient asleep.  Mom wants inpatient rehab here at [facility]  Per attending MD, will wait to see how patient does today and then plan for inpatient rehab tomorrow if patient stable over night.  I will follow up in am.  Call me for questions.  #[phone]
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
## 2016-08-12 NOTE — Progress Notes (Signed)
Encountered pt's mom Aram BeechamCynthia in hall on way to see pt and exchanged words of encouragement and blessing with her there,and w/ healthcare providers and pt's aunt in rm -- where aunt said from bathrm pt also told her to tell me hello. Promised to pray for all, esp. continuing recovery for pt whom Aram BeechamCynthia said by next Tuesday (the first day I might see them again) might be in rehab. Advised her in meantime to ask nurse to page chaplain if desired.    08/12/16 1700  Clinical Encounter Type  Visited With Family;Patient and family together  Visit Type Follow-up;Psychological support;Spiritual support;Social support  Referral From Chaplain  Spiritual Encounters  Spiritual Needs Emotional  Stress Factors  Patient Stress Factors Health changes;Loss of control  Family Stress Factors Family relationships;Health changes;Loss of control   Ephraim Hamburgerynthia A Francile Woolford, [address]

## 2016-08-12 NOTE — Progress Notes (Signed)
Pt's wife called and after giving me password, asked, "what is process for getting him tranferred somewhere else, bc I am moving."  Explained to wife that pt is doing well, and will be looking to go to rehab post dc in next day or two.  Wife replied with, "oh, ok.  Nevermind".  Pt's mother aware of wife's call and states, "before anyone tries to get him transferred anywhere, I want to make sure that she shows proof of marriage".  Notified case Production designer, theatre/television/filmmanager, Raynelle FanningJulie.

## 2016-08-12 NOTE — Progress Notes (Signed)
Stopped by to visit w/ family in rm: pt's aunt and dad. Provided emotional/spiritual support and prayer (in which nursing tech joined in). Pt's mom came in at end of visit and seemed to be holding up ok. Chaplain available for f/u.    08/11/16 1600  Clinical Encounter Type  Visited With Patient and family together;Health care provider  Visit Type Follow-up;Psychological support;Spiritual support;Social support;Critical Care  Referral From Chaplain  Spiritual Encounters  Spiritual Needs Prayer;Emotional  Stress Factors  Patient Stress Factors Health changes;Loss of control  Family Stress Factors Family relationships;Health changes;Loss of control   Luke Evans, [address]

## 2016-08-13 ENCOUNTER — Inpatient Hospital Stay (HOSPITAL_COMMUNITY)
Admission: RE | Admit: 2016-08-13 | Discharge: 2016-08-17 | DRG: 949 | Disposition: A | Discharge status: Home / Self Care | Payer: No Typology Code available for payment source | Source: Intra-hospital | Attending: Physical Medicine & Rehabilitation | Admitting: Physical Medicine & Rehabilitation

## 2016-08-13 DIAGNOSIS — D62 Acute posthemorrhagic anemia: Secondary | ICD-10-CM

## 2016-08-13 DIAGNOSIS — Z8659 Personal history of other mental and behavioral disorders: Secondary | ICD-10-CM

## 2016-08-13 DIAGNOSIS — K5900 Constipation, unspecified: Secondary | ICD-10-CM | POA: Diagnosis not present

## 2016-08-13 DIAGNOSIS — J9600 Acute respiratory failure, unspecified whether with hypoxia or hypercapnia: Secondary | ICD-10-CM | POA: Diagnosis present

## 2016-08-13 DIAGNOSIS — R45851 Suicidal ideations: Secondary | ICD-10-CM

## 2016-08-13 DIAGNOSIS — S069X9A Unspecified intracranial injury with loss of consciousness of unspecified duration, initial encounter: Secondary | ICD-10-CM | POA: Diagnosis present

## 2016-08-13 DIAGNOSIS — F918 Other conduct disorders: Secondary | ICD-10-CM

## 2016-08-13 DIAGNOSIS — S062XAA Diffuse traumatic brain injury with loss of consciousness status unknown, initial encounter: Secondary | ICD-10-CM

## 2016-08-13 DIAGNOSIS — S06300D Unspecified focal traumatic brain injury without loss of consciousness, subsequent encounter: Secondary | ICD-10-CM | POA: Diagnosis present

## 2016-08-13 DIAGNOSIS — S062X9A Diffuse traumatic brain injury with loss of consciousness of unspecified duration, initial encounter: Secondary | ICD-10-CM

## 2016-08-13 DIAGNOSIS — S069XAA Unspecified intracranial injury with loss of consciousness status unknown, initial encounter: Secondary | ICD-10-CM | POA: Diagnosis present

## 2016-08-13 DIAGNOSIS — S069X3S Unspecified intracranial injury with loss of consciousness of 1 hour to 5 hours 59 minutes, sequela: Secondary | ICD-10-CM

## 2016-08-13 DIAGNOSIS — I10 Essential (primary) hypertension: Secondary | ICD-10-CM | POA: Diagnosis not present

## 2016-08-13 DIAGNOSIS — E876 Hypokalemia: Secondary | ICD-10-CM | POA: Diagnosis not present

## 2016-08-13 DIAGNOSIS — R4689 Other symptoms and signs involving appearance and behavior: Secondary | ICD-10-CM

## 2016-08-13 DIAGNOSIS — S51012A Laceration without foreign body of left elbow, initial encounter: Secondary | ICD-10-CM | POA: Diagnosis present

## 2016-08-13 MED ORDER — ONDANSETRON HCL 4 MG PO TABS
4.0000 mg | ORAL_TABLET | Freq: Four times a day (QID) | ORAL | Status: DC | PRN
  Administered 2016-08-13: 4 mg via ORAL
  Filled 2016-08-13: qty 1

## 2016-08-13 MED ORDER — OXYCODONE HCL 5 MG PO TABS
5.0000 mg | ORAL_TABLET | ORAL | Status: DC | PRN
  Administered 2016-08-13 (×2): 10 mg via ORAL
  Administered 2016-08-14: 5 mg via ORAL
  Administered 2016-08-15: 10 mg via ORAL
  Filled 2016-08-13 (×4): qty 2

## 2016-08-13 MED ORDER — LORAZEPAM 0.5 MG PO TABS: 0.5000 mg | ORAL_TABLET | ORAL | Status: DC | PRN

## 2016-08-13 MED ORDER — PANTOPRAZOLE SODIUM 40 MG IV SOLR: 40.0000 mg | Freq: Every day | INTRAVENOUS | Status: DC

## 2016-08-13 MED ORDER — ONDANSETRON HCL 4 MG/2ML IJ SOLN: 4.0000 mg | Freq: Four times a day (QID) | INTRAMUSCULAR | Status: DC | PRN

## 2016-08-13 MED ORDER — PANTOPRAZOLE SODIUM 40 MG PO TBEC
40.0000 mg | DELAYED_RELEASE_TABLET | Freq: Every day | ORAL | Status: DC
  Administered 2016-08-14 – 2016-08-17 (×4): 40 mg via ORAL
  Filled 2016-08-13 (×4): qty 1

## 2016-08-13 MED ORDER — BACITRACIN ZINC 500 UNIT/GM EX OINT
TOPICAL_OINTMENT | Freq: Two times a day (BID) | CUTANEOUS | Status: DC
Start: 2016-08-13 — End: 2016-08-17
  Administered 2016-08-13: 23:00:00 via TOPICAL
  Administered 2016-08-14: 1 via TOPICAL
  Administered 2016-08-14: 22:00:00 via TOPICAL
  Administered 2016-08-15: 1 via TOPICAL
  Administered 2016-08-16 – 2016-08-17 (×3): via TOPICAL
  Filled 2016-08-13 (×4): qty 28.35

## 2016-08-13 MED ORDER — DIVALPROEX SODIUM 250 MG PO DR TAB
250.0000 mg | DELAYED_RELEASE_TABLET | Freq: Three times a day (TID) | ORAL | Status: DC
  Administered 2016-08-13 – 2016-08-17 (×12): 250 mg via ORAL
  Filled 2016-08-13 (×12): qty 1

## 2016-08-13 MED ORDER — DOCUSATE SODIUM 100 MG PO CAPS
100.0000 mg | ORAL_CAPSULE | Freq: Two times a day (BID) | ORAL | Status: DC
  Administered 2016-08-13 – 2016-08-17 (×8): 100 mg via ORAL
  Filled 2016-08-13 (×8): qty 1

## 2016-08-13 MED ORDER — SORBITOL 70 % SOLN: 30.0000 mL | Freq: Every day | Status: DC | PRN
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
## 2016-08-13 NOTE — Progress Notes (Signed)
[facility].  Pt has no s/s of any acute distress or pain.
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
## 2016-08-14 ENCOUNTER — Inpatient Hospital Stay (HOSPITAL_COMMUNITY): Payer: Self-pay | Admitting: Occupational Therapy

## 2016-08-14 ENCOUNTER — Inpatient Hospital Stay (HOSPITAL_COMMUNITY): Payer: No Typology Code available for payment source | Admitting: Physical Therapy

## 2016-08-14 ENCOUNTER — Inpatient Hospital Stay (HOSPITAL_COMMUNITY): Payer: No Typology Code available for payment source | Admitting: Speech Pathology

## 2016-08-14 DIAGNOSIS — S062X3S Diffuse traumatic brain injury with loss of consciousness of 1 hour to 5 hours 59 minutes, sequela: Secondary | ICD-10-CM

## 2016-08-14 DIAGNOSIS — S062X9A Diffuse traumatic brain injury with loss of consciousness of unspecified duration, initial encounter: Secondary | ICD-10-CM

## 2016-08-14 DIAGNOSIS — I10 Essential (primary) hypertension: Secondary | ICD-10-CM

## 2016-08-14 DIAGNOSIS — D62 Acute posthemorrhagic anemia: Secondary | ICD-10-CM

## 2016-08-14 DIAGNOSIS — S062XAA Diffuse traumatic brain injury with loss of consciousness status unknown, initial encounter: Secondary | ICD-10-CM

## 2016-08-14 DIAGNOSIS — E876 Hypokalemia: Secondary | ICD-10-CM
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
## 2016-08-15 ENCOUNTER — Inpatient Hospital Stay (HOSPITAL_COMMUNITY): Payer: Self-pay | Admitting: Occupational Therapy

## 2016-08-15 DIAGNOSIS — Z8659 Personal history of other mental and behavioral disorders: Secondary | ICD-10-CM

## 2016-08-15 MED ORDER — AMLODIPINE BESYLATE 2.5 MG PO TABS
2.5000 mg | ORAL_TABLET | Freq: Every day | ORAL | Status: DC
  Administered 2016-08-15 – 2016-08-16 (×2): 2.5 mg via ORAL
  Filled 2016-08-15 (×2): qty 1
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
## 2016-08-15 NOTE — Progress Notes (Addendum)
Occupational Therapy Session Note  Patient Details  Name: Luke Evans MRN: [id number] Date of Birth: 05/15/1991  Today's Date: 08/15/2016 OT Individual Time: [id number] OT Individual Time Calculation (min): 45 min    Short Term Goals: Week 1:  OT Short Term Goal 1 (Week 1): STGs=LTGs due to ELOS  Skilled Therapeutic Interventions/Progress Updates:   Skilled OT session completed with focus on dynamic standing balance and cognition/memory. Pt was sitting in recliner at time of arrival, agreeable to tx. He ambulated without AD and supervision to gym, engaged in basketball tasks involving dribbling without visually attending to ball (for cervical precaution adherence), shooting baskets, and fast tosses back and forth outside of base of support to improve balance. While completing activities, pt participated in cognitive tasks including counted backwards from 100 by multiples of 5 and then 3 and recalling the 50 states (able to name 30/50). Afterwards pt jogged back to room with supervision, able to pathfind without cues. He was left in recliner with sitter at time of departure and all needs within reach.   Therapy Documentation Precautions:  Precautions Precautions: Fall, Cervical Precaution Comments: Per MD no c collar needed Restrictions Weight Bearing Restrictions: No General:   Vital Signs: Therapy Vitals BP: 138/66 Pain: No c/o pain during session    ADL: ADL ADL Comments: Please see functional navigator for ADL status    See Function Navigator for Current Functional Status.   Therapy/Group: Individual Therapy  Tasnim Balentine A Tyquon Near 08/15/2016, 12:53 PM

## 2016-08-16 ENCOUNTER — Inpatient Hospital Stay (HOSPITAL_COMMUNITY): Payer: No Typology Code available for payment source | Admitting: Physical Therapy

## 2016-08-16 ENCOUNTER — Inpatient Hospital Stay (HOSPITAL_COMMUNITY): Payer: No Typology Code available for payment source | Admitting: Occupational Therapy

## 2016-08-16 ENCOUNTER — Inpatient Hospital Stay (HOSPITAL_COMMUNITY): Payer: No Typology Code available for payment source

## 2016-08-16 ENCOUNTER — Inpatient Hospital Stay (HOSPITAL_COMMUNITY): Payer: No Typology Code available for payment source | Admitting: Speech Pathology

## 2016-08-16 DIAGNOSIS — S069X0S Unspecified intracranial injury without loss of consciousness, sequela: Secondary | ICD-10-CM

## 2016-08-16 DIAGNOSIS — S062X0S Diffuse traumatic brain injury without loss of consciousness, sequela: Secondary | ICD-10-CM

## 2016-08-16 DIAGNOSIS — D62 Acute posthemorrhagic anemia: Secondary | ICD-10-CM

## 2016-08-16 DIAGNOSIS — S069X4A Unspecified intracranial injury with loss of consciousness of 6 hours to 24 hours, initial encounter: Secondary | ICD-10-CM

## 2016-08-16 LAB — COMPREHENSIVE METABOLIC PANEL
ALK PHOS: 62 U/L (ref 38–126)
ALT: 17 U/L (ref 17–63)
ANION GAP: 9 (ref 5–15)
AST: 19 U/L (ref 15–41)
Albumin: 3.7 g/dL (ref 3.5–5.0)
BUN: 7 mg/dL (ref 6–20)
CALCIUM: 9.8 mg/dL (ref 8.9–10.3)
CHLORIDE: 103 mmol/L (ref 101–111)
CO2: 28 mmol/L (ref 22–32)
Creatinine, Ser: 0.98 mg/dL (ref 0.61–1.24)
GFR calc Af Amer: >60 mL/min (ref >60)
GFR calc non Af Amer: >60 mL/min (ref >60)
GLUCOSE: 78 mg/dL (ref 65–99)
Potassium: 3.7 mmol/L (ref 3.5–5.1)
Sodium: 140 mmol/L (ref 135–145)
Total Bilirubin: 0.7 mg/dL (ref 0.3–1.2)
Total Protein: 7.5 g/dL (ref 6.5–8.1)

## 2016-08-16 LAB — CBC WITH DIFFERENTIAL/PLATELET
BASOS PCT: 0 %
Basophils Absolute: 0 10*3/uL (ref 0.0–0.1)
EOS PCT: 3 %
Eosinophils Absolute: 0.4 10*3/uL (ref 0.0–0.7)
HEMATOCRIT: 41.2 % (ref 39.0–52.0)
Hemoglobin: 13.7 g/dL (ref 13.0–17.0)
LYMPHS ABS: 2.9 10*3/uL (ref 0.7–4.0)
Lymphocytes Relative: 23 %
MCH: 23.8 pg — AB (ref 26.0–34.0)
MCHC: 33.3 g/dL (ref 30.0–36.0)
MCV: 71.5 fL — AB (ref 78.0–100.0)
MONO ABS: 1 10*3/uL (ref 0.1–1.0)
MONOS PCT: 8 %
NEUTROS ABS: 8.1 10*3/uL — AB (ref 1.7–7.7)
Neutrophils Relative %: 66 %
Platelets: 278 10*3/uL (ref 150–400)
RBC: 5.76 MIL/uL (ref 4.22–5.81)
RDW: 14.1 % (ref 11.5–15.5)
WBC: 12.4 10*3/uL — AB (ref 4.0–10.5)
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
## 2016-08-16 NOTE — Progress Notes (Signed)
Speech Language Pathology Daily Session Note  Patient Details  Name: Luke Evans MRN: [id number] Date of Birth: 02/04/1991  Today's Date: 08/16/2016 SLP Individual Time: 1400-1505 SLP Individual Time Calculation (min): 65 min   Short Term Goals: Week 1: SLP Short Term Goal 1 (Week 1): STGs=LTGs due to short length of stay    Skilled Therapeutic Interventions: Skilled treatment session focused on cognitive goals. SLP facilitated session by providing supervision visual cues for recall of events from previous therapy sessions and extra time for short-term recall with use of compensatory strategies during a novel task. Patient also required supervision question cues for complex problem solving during a scheduling task. Patient left supine in bed with brother and sitter present. Continue with current plan of care.   Function:  Cognition Comprehension Comprehension assist level: Follows complex conversation/direction with extra time/assistive device  Expression   Expression assist level: Expresses complex ideas: With extra time/assistive device  Social Interaction Social Interaction assist level: Interacts appropriately with others with medication or extra time (anti-anxiety, antidepressant).  Problem Solving Problem solving assist level: Solves complex problems: With extra time  Memory Memory assist level: More than reasonable amount of time    Pain Pain Assessment Pain Assessment: No/denies pain  Therapy/Group: Individual Therapy  Taimi Towe 08/16/2016, 3:20 PM

## 2016-08-16 NOTE — Care Management Note (Signed)
[facility] Individual Statement of Services  Patient Name:  Luke Evans  Date:  08/16/2016  Welcome to the [facility].  Our goal is to provide you with an individualized program based on your diagnosis and situation, designed to meet your specific needs.  With this comprehensive rehabilitation program, you will be expected to participate in at least 3 hours of rehabilitation therapies Monday-Friday, with modified therapy programming on the weekends.  Your rehabilitation program will include the following services:  Physical Therapy (PT), Occupational Therapy (OT), Speech Therapy (ST), 24 hour per day rehabilitation nursing, Neuropsychology, Case Management (Social Worker), Rehabilitation Medicine, Nutrition Services and Pharmacy Services  Weekly team conferences will be held on Tuesdays to discuss your progress.  Your Social Worker will talk with you frequently to get your input and to update you on team discussions.  Team conferences with you and your family in attendance may also be held.  Expected length of stay: 3-5 days  Overall anticipated outcome: supervision  Depending on your progress and recovery, your program may change. Your Social Worker will coordinate services and will keep you informed of any changes. Your Social Worker's name and contact numbers are listed  below.  The following services may also be recommended but are not provided by the [facility]:   Driving Evaluations  Home Health Rehabiltiation Services  Outpatient Rehabilitation Services  Vocational Rehabilitation   Arrangements will be made to provide these services after discharge if needed.  Arrangements include referral to agencies that provide these services.  Your insurance has been verified to be:  None  Your primary doctor is:  None  Pertinent information will be shared with your doctor and your insurance company.  Social Worker:   [place] Edwin [place], [place] [phone] or ([phone]) [phone]   Information discussed with and copy given to patient by: Amada JupiterHOYLE, Teniola Tseng, 08/16/2016, 3:22 PM
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
## 2016-08-16 NOTE — Progress Notes (Signed)
Physical Therapy Session Note  Patient Details  Name: Luke Evans MRN: [id number] Date of Birth: 05/12/1991  Today's Date: 08/16/2016 PT Individual Time: 1127-1157 PT Individual Time Calculation (min): 30 min    Short Term Goals: Week 1:  PT Short Term Goal 1 (Week 1): STGs = LTGs  Skilled Therapeutic Interventions/Progress Updates:    Session focused on addressing functional mobility and higher level balance to prepare for discharge home. Pt completed mobility at overall independent to modified independent level and able to independently recall cervical precautions and ways to modify mobility activities to adhere to these. Engaged in simulated car transfer, gait up/down ramp without railing, gait over mulched surface for community mobility, stair negotiation in stairwell with and without railings to challenge balance, transfers in ADL apartment to simulate home environment, and floor transfers (without support to get back up). Administered Berg Balance test to assess fall risk - see results below. Pt demonstrates some decreased stability on RLE during single limb tasks but able to regain balance and self correct without cues. Pt somewhat distractable during session but easily redirected. Left in room with sitter and brother present.   Therapy Documentation Precautions:  Precautions Precautions: Fall, Cervical Precaution Comments: Per MD no c collar needed Restrictions Weight Bearing Restrictions: No Pain:  No complaints of pain. Reports some soreness from "working out in therapy" this morning in legs.     Balance: Standardized Balance Assessment Standardized Balance Assessment: Berg Balance Test Berg Balance Test Sit to Stand: Able to stand without using hands and stabilize independently Standing Unsupported: Able to stand safely 2 minutes Sitting with Back Unsupported but Feet Supported on Floor or Stool: Able to sit safely and securely 2 minutes Stand to Sit: Sits safely  with minimal use of hands Transfers: Able to transfer safely, minor use of hands Standing Unsupported with Eyes Closed: Able to stand 10 seconds safely Standing Ubsupported with Feet Together: Able to place feet together independently and stand 1 minute safely From Standing, Reach Forward with Outstretched Arm: Can reach confidently >25 cm (10") From Standing Position, Pick up Object from Floor: Able to pick up shoe safely and easily From Standing Position, Turn to Look Behind Over each Shoulder: Looks behind from both sides and weight shifts well (turns body only due to cervical precautions; ROM WNL) Turn 360 Degrees: Able to turn 360 degrees safely in 4 seconds or less Standing Unsupported, Alternately Place Feet on Step/Stool: Able to stand independently and safely and complete 8 steps in 20 seconds Standing Unsupported, One Foot in Front: Able to place foot tandem independently and hold 30 seconds Standing on One Leg: Able to lift leg independently and hold > 10 seconds Total Score: 56  See Function Navigator for Current Functional Status.   Therapy/Group: Individual Therapy  Karolee StampsGray, Hutch Rhett Darrol PokeBrescia  Virdia Ziesmer B. Cyncere Sontag, PT, DPT  08/16/2016, 12:02 PM
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
## 2016-08-16 NOTE — Progress Notes (Addendum)
Physical Therapy Session Note  Patient Details  Name: Luke Evans MRN: [id number] Date of Birth: 03/12/1991  Today's Date: 08/16/2016 PT Individual Time: 1535-1620 PT Individual Time Calculation (min): 45 min    Short Term Goals: Week 1:  PT Short Term Goal 1 (Week 1): STGs = LTGs  Skilled Therapeutic Interventions/Progress Updates:    Patient in bed upon arrival, donned shoes sitting EOB. Patient instructed in HiMAT (High Level Mobility Assessment Tool) including course setup with rest breaks as needed due to fatigue and unrated L anterior thigh "tightness". Patient scored 35/54 (per COMBI no normative values yet developed for HiMAT) with highest scores for walking backward, hopping, bounding RLE to LLE and LLE to RLE and lowest scores for walking forward, running, and negotiating up/down 14 stairs without rail and reciprocal pattern in stairwell. Patient able to complete all tasks as instructed but required multiple cues and demonstration to complete bounding tasks successfully. At end of outcome, patient summed written scores and was off by 1 point. Patient left sitting EOB in room with brother and sitter present.   Therapy Documentation Precautions:  Precautions Precautions: Fall, Cervical Precaution Comments: Per MD no c collar needed Restrictions Weight Bearing Restrictions: No Pain: Pain Assessment Pain Assessment: No/denies pain  See Function Navigator for Current Functional Status.   Therapy/Group: Individual Therapy  Jaymin Waln, Prudencio PairRebecca A 08/16/2016, 4:33 PM

## 2016-08-16 NOTE — Progress Notes (Addendum)
Physical Therapy Session Note  Patient Details  Name: Luke Evans MRN: [id number] Date of Birth: 03/04/1991  Today's Date: 08/16/2016 PT Individual Time: 0800-0900 PT Individual Time Calculation (min): 60 min    Short Term Goals: Week 1:  PT Short Term Goal 1 (Week 1): STGs = LTGs  Skilled Therapeutic Interventions/Progress Updates:    Pt received in room, agreeable to tx, noting intermittent L thigh pain. Session focused on dual cognitive tasks, high level gait training, pt education, and strength/endurance training. Pt participated in Wii Tennis & Bowling and dribbling basketball down hallway while engaging in dual cognitive tasks. High level gait and balance training with pt performing braiding & backwards jogging. Educated pt on cervical precautions and L hip flexor stretch with pt return demonstrating. Pt utilized nu-step level 7 x 5 minutes 30 seconds with BLE only focusing on strengthening & endurance training; pt reported 15 on Borg RPE scale. At end of session pt left in care of sitter, ambulating to nurses station to heat up breakfast.   Pt's brother present to observe session.   Therapy Documentation Precautions:  Precautions Precautions: Fall, Cervical Precaution Comments: Per MD no c collar needed Restrictions Weight Bearing Restrictions: No   See Function Navigator for Current Functional Status.   Therapy/Group: Individual Therapy  Sandi MariscalVictoria M Duvid Smalls 08/16/2016, 9:06 AM

## 2016-08-17 ENCOUNTER — Inpatient Hospital Stay (HOSPITAL_COMMUNITY): Payer: Self-pay | Admitting: Speech Pathology

## 2016-08-17 ENCOUNTER — Inpatient Hospital Stay (HOSPITAL_COMMUNITY): Payer: No Typology Code available for payment source | Admitting: Physical Therapy

## 2016-08-17 ENCOUNTER — Inpatient Hospital Stay (HOSPITAL_COMMUNITY): Payer: Self-pay | Admitting: Physical Therapy

## 2016-08-17 ENCOUNTER — Inpatient Hospital Stay (HOSPITAL_COMMUNITY): Payer: No Typology Code available for payment source | Admitting: Occupational Therapy

## 2016-08-17 MED ORDER — DIVALPROEX SODIUM 250 MG PO DR TAB: 250.0000 mg | DELAYED_RELEASE_TABLET | Freq: Two times a day (BID) | ORAL | 1 refills | Status: AC

## 2016-08-17 MED ORDER — LORAZEPAM 0.5 MG PO TABS: 0.5000 mg | ORAL_TABLET | ORAL | 0 refills | Status: AC | PRN

## 2016-08-17 MED ORDER — OXYCODONE HCL 5 MG PO TABS: 5.0000 mg | ORAL_TABLET | ORAL | 0 refills | Status: AC | PRN

## 2016-08-17 MED ORDER — DIVALPROEX SODIUM 250 MG PO DR TAB: 250.0000 mg | DELAYED_RELEASE_TABLET | Freq: Two times a day (BID) | ORAL | Status: DC

## 2016-08-17 NOTE — Progress Notes (Signed)
Occupational Therapy Session Note  Patient Details  Name: Luke Evans MRN: [id number] Date of Birth: 12/15/1990  Today's Date: 08/17/2016 OT Individual Time: [id number] OT Individual Time Calculation (min): 60 min     Short Term Goals:No short term goals set  Skilled Therapeutic Interventions/Progress Updates:    Pt was already dressed and stated he did not have a clean outfit to change into therefore he declined a shower or bath.  Pt reports he dressed, toileted, groomed today without any difficulty or help needed.  Asked pt if he would like to work on 2 baking projects to address multitasking, multi step directions, and problem solving.  Pt excited to engage in these tasks. Ambulated to ADL apt kitchen independently and he chose two baking items based on the fact that we only had 2 eggs available. He chose corn bread and PB cookies.  Pt was independent with reading the directions, finding all cooking supplies and problem solving. One recipe was for 400 at 20 min and the other for 350 for 10 min, so he problem solved timing of when to prepare each recipe. For ex, he independently chose the corn bread as it took longer to bake and then he could make the cookies while the corn bread was baking.  He was able to work at an efficient speed to complete 2 baking projects including washing the dishes in 60 minutes. He demonstrated no difficulty with multitasking and following directions.  Pt did take occasional sit breaks on the bar stool as he was mixing the dough due to fatigue in L leg. He did state the pain is much better than yesterday.  Discussed pt's home life and his goals for home. He plans to go home this afternoon.  Pt is now I in the room.   Therapy Documentation Precautions:  Precautions Precautions: Fall, Cervical Precaution Comments: Per MD no c collar needed Restrictions Weight Bearing Restrictions: No   Pain:  mild discomfort in LLE  ADL: ADL ADL Comments: Please see  functional navigator for ADL status     See Function Navigator for Current Functional Status.   Therapy/Group: Individual Therapy  Sherian Valenza 08/17/2016, 10:21 AM

## 2016-08-17 NOTE — Progress Notes (Addendum)
Physical Therapy Discharge Summary  Patient Details  Name: Luke Evans MRN: [id number] Date of Birth: [birth year]  Today's Date: 08/17/2016 PT Individual Time: 0815-0900 PT Individual Time Calculation (min): 45 min  Pt missed 15 minutes scheduled PT treatment time 2/2 eating breakfast.    Patient has met 7 of 7 long term goals due to improved activity tolerance, improved balance, improved postural control, increased strength, improved attention, improved awareness and improved coordination.  Patient is discharging at a Mod I level with functional mobility but this therapist recommends that pt d/c home with 24 hr supervision 2/2 cognitive deficits & to ensure safety. Pt did not have any family present for formal family education.   Reasons goals not met: n/a  Recommendation:  Patient will benefit from ongoing skilled PT services in outpatient setting to continue to advance safe functional mobility, address ongoing impairments in cognitive deficits, pt/family education, and returning to PLOF working in community (when cleared by MD).  Equipment: No equipment provided  Reasons for discharge: treatment goals met  Patient/family agrees with progress made and goals achieved: Yes  Skilled PT Treatment: Pt received in bed reporting he just received breakfast & requesting therapist return in 10 minutes. Returned to see pt & pt agreeable to tx denying c/o pain. Session focused on pt education & grad day activities. Provided pt with written instructions for BLE hip flexor, hamstring, and heel cord stretches with pt return demonstrating all three. Pt performed single leg stance on bosu ball without BUE support with task focusing on high level balance training. Pt ambulated throughout unit (even & uneven surface, ramp) and negotiated stairs (24 steps with B rails and curb without AD) with mod I. Pt also at mod I level for transfers & bed mobility. Pt completed toileting during session with mod I.  At end of session pt left in room with sitter present & all needs within reach.   Reviewed cervical precautions with pt, educating him on modifications and ways to maintain precautions.   PT Discharge Precautions/Restrictions Precautions Precautions: Cervical (no cervical collar) Restrictions Weight Bearing Restrictions: No  Cognition Overall Cognitive Status: Impaired/Different from baseline Orientation Level: Oriented X4 Safety/Judgment: Impaired Rancho BuildDNA.es Scales of Cognitive Functioning: Purposeful/appropriate  Mobility Bed Mobility Bed Mobility: Supine to Sit;Sit to Supine Supine to Sit: 6: Modified independent (Device/Increase time) Sit to Supine: 6: Modified independent (Device/Increase time) Transfers Transfers: Yes Sit to Stand: 6: Modified independent (Device/Increase time) Stand to Sit: 6: Modified independent (Device/Increase time)  Locomotion  Ambulation Ambulation: Yes Ambulation/Gait Assistance: 6: Modified independent (Device/Increase time) Ambulation Distance (Feet): 150 Feet Assistive device: None High Level Ambulation High Level Ambulation: Backwards walking Backwards Walking: supervision assist Stairs / Additional Locomotion Stairs: Yes Stairs Assistance: 6: Modified independent (Device/Increase time) Stair Management Technique: Two rails Number of Stairs: 24 Height of Stairs:  (6" + 3") Ramp: 6: Modified independent (Device) (ambulatory without AD) Curb: 6: Modified independent (Device/increase time) (ambulatory without AD) Wheelchair Mobility Wheelchair Mobility: No   Trunk/Postural Assessment  Cervical Assessment Cervical Assessment: Within Functional Limits Thoracic Assessment Thoracic Assessment: Within Functional Limits Lumbar Assessment Lumbar Assessment: Within Functional Limits   Extremity Assessment  RLE Assessment RLE Assessment: Within Functional Limits LLE Assessment LLE Assessment: Within Functional Limits   See  Function Navigator for Current Functional Status.  Waunita Schooner 08/17/2016, 5:27 PM

## 2016-08-17 NOTE — Progress Notes (Signed)
Occupational Therapy Discharge Summary  Patient Details  Name: Luke Evans MRN: [id number] Date of Birth: Apr 29, 1991   Patient has met 65 of 11 long term goals due to improved activity tolerance, improved balance and improved memory and problem solving..  Patient to discharge at overall Independent level.  Patient's care partner is independent to provide the necessary cognitive (high level cognitive tasks of STM) assistance at discharge.    Reasons goals not met: n/a  Recommendation:  No further OT recommended at this time as pt is very independent with self care, homemaking, cooking.  Equipment: No equipment provided  Reasons for discharge: treatment goals met  Patient/family agrees with progress made and goals achieved: Yes  OT Discharge Precautions/Restrictions  Precautions Precautions: Fall;Cervical Precaution Comments: Per MD no c collar needed Restrictions Weight Bearing Restrictions: No ADL ADL ADL Comments: fully independent with no equipment needed Vision/Perception  Vision- History Baseline Vision/History: No visual deficits Vision- Assessment Vision Assessment?: No apparent visual deficits  Cognition Overall Cognitive Status: Within Functional Limits for tasks assessed Orientation Level: Oriented to person;Oriented to place;Oriented to situation;Oriented to time Sensation Sensation Light Touch: Appears Intact Stereognosis: Appears Intact Hot/Cold: Appears Intact Proprioception: Appears Intact Coordination Gross Motor Movements are Fluid and Coordinated: Yes Fine Motor Movements are Fluid and Coordinated: Yes Motor  Motor Motor: Within Functional Limits Mobility    independent Trunk/Postural Assessment  Thoracic Assessment Thoracic Assessment: Within Functional Limits Lumbar Assessment Lumbar Assessment: Within Functional Limits Postural Control Postural Control: Within Functional Limits  Balance Static Sitting Balance Static Sitting -  Level of Assistance: 7: Independent Static Standing Balance Static Standing - Level of Assistance: 7: Independent Dynamic Standing Balance Dynamic Standing - Level of Assistance: 7: Independent Extremity/Trunk Assessment RUE Assessment RUE Assessment: Within Functional Limits LUE Assessment LUE Assessment: Within Functional Limits   See Function Navigator for Current Functional Status.  [place] 08/17/2016, 12:33 PM

## 2016-08-17 NOTE — Plan of Care (Signed)
Problem: RH Ambulation Goal: LTG Patient will ambulate in controlled environment (PT) LTG: Patient will ambulate in a controlled environment, # of feet with assistance (PT).  Outcome: Completed/Met Date Met: 08/17/16 >150 ft without AD Goal: LTG Patient will ambulate in community environment (PT) LTG: Patient will ambulate in community environment, # of feet with assistance (PT).  Outcome: Adequate for Discharge Unable to ambulate off of unit 2/2 suicide precautions  Problem: RH Stairs Goal: LTG Patient will ambulate up and down stairs w/assist (PT) LTG: Patient will ambulate up and down # of stairs with assistance (PT)  Outcome: Completed/Met Date Met: 08/17/16 24 steps with B rails

## 2016-08-17 NOTE — Progress Notes (Signed)
Social Work  Discharge Note  The overall goal for the admission was met for:   Discharge location: Yes - home with mother who will provide 24/7 supervision  Length of Stay: Yes - 4 days  Discharge activity level: Yes - modified indepent/ supervision (cognition)  Home/community participation: Yes  Services provided included: MD, RD, PT, OT, SLP, RN, TR, Pharmacy, Neuropsych and SW  Financial Services:  NONE - pt uninsured and MD does not feel he has a long-term disability  Follow-up services arranged: only therapy to feel may benefit from therapy is [facility], however, pt does not have a payor source and cannot privately pay for services.  Comments (or additional information):  Provided pt and brother with information on:  1) [facility];  2)  Vocational Rehab and 3) local mental health resources and strongly encouraging that he access these resources.  Brother listening to information and took the handouts.  Very receptive to recommendations.  Patient/Family verbalized understanding of follow-up arrangements: Yes  Individual responsible for coordination of the follow-up plan: pt  Confirmed correct DME delivered: None needed    Safari Cinque
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
## 2016-08-17 NOTE — Progress Notes (Signed)
Physical Therapy Note  Patient Details  Name: Luke Evans MRN: [id number] Date of Birth: 05/21/1991 Today's Date: 08/17/2016    Time: 1130-1200 30 minutes  1:1 No c/o pain. Session focused on cognitive goals of attention and problem solving with quirkle game with pt able to play with increased time and few cues for memory of rules and game play.  Pt independent with all gait and transfers.  Sosha Shepherd 08/17/2016, 2:54 PM

## 2016-08-17 NOTE — Discharge Summary (Signed)
Discharge summary job [id number]
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
## 2016-08-17 NOTE — Progress Notes (Signed)
Patient discharged to home with brother at [id number]. Harvel Ricksan Anguilli PA provided patient with all discharge instructions. Per PA patient denied any questions. Patient discharged with all belongings.

## 2016-08-17 NOTE — Progress Notes (Signed)
Speech Language Pathology Session Note & Discharge Summary  Patient Details  Name: Luke Evans MRN: [id number] Date of Birth: [birth year]  Today's Date: 08/17/2016 SLP Individual Time: 1300-1330 SLP Individual Time Calculation (min): 30 min  Skilled Therapeutic Interventions:  Skilled treatment session focused on cognitive goals. Patient was re-administered the MoCA (version 7.3) and scored 29/30 points with a score of 26 or above considered normal. Educated the patient and his family in regards to his current cognitive function and strategies to utilize at home to maximize recall, problem solving and overall independence. All verbalized understanding. Patient will discharge home with 24 hour supervision from family.   Patient has met 4 of 4 long term goals.  Patient to discharge at overall Supervision level.   Reasons goals not met: N/A   Clinical Impression/Discharge Summary: Patient has made excellent gains and has met 4 of 4 LTG's this admission. Currently, patient is demonstrating behaviors consistent with a Rancho Level VIII and requires overall Mod I-supervision to complete functional and familiar tasks safely in regards to problem solving, awareness, recall and overall safety. Patient and family education is complete. Patient will discharge home with 24 hour supervision from family.   Care Partner:  Caregiver Able to Provide Assistance: Yes     Recommendation:  24 hour supervision/assistance      Equipment: N/A   Reasons for discharge: Discharged from hospital;Treatment goals met   Patient/Family Agrees with Progress Made and Goals Achieved: Yes   Function:  Eating Eating   Modified Consistency Diet: Yes Eating Assist Level: Supervision or verbal cues;More than reasonable amount of time;Set up assist for   Eating Set Up Assist For: Opening containers       Cognition Comprehension Comprehension assist level: Follows complex conversation/direction with extra  time/assistive device  Expression   Expression assist level: Expresses complex ideas: With extra time/assistive device  Social Interaction Social Interaction assist level: Interacts appropriately with others with medication or extra time (anti-anxiety, antidepressant).  Problem Solving Problem solving assist level: Solves complex problems: With extra time  Memory Memory assist level: More than reasonable amount of time   [name] 08/17/2016, 3:05 PM
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
## 2016-09-06 ENCOUNTER — Encounter
Payer: No Typology Code available for payment source | Attending: Physical Medicine & Rehabilitation | Admitting: Physical Medicine & Rehabilitation

## 2018-01-17 IMAGING — CT CT HEAD W/O CM
4 series · 16 of 47 positions shown, 18 images · non-contrast
Comparison: CT HEAD August 09, 2016

CLINICAL DATA: Follow-up after extubation.  Traumatic brain injury.

EXAM:
CT HEAD WITHOUT CONTRAST
TECHNIQUE: Contiguous axial images were obtained from the base of the skull
through the vertex without intravenous contrast.

[Series 2: head without · axial · non-contrast · 0.46mm/px · z∈[-152,-27]mm · 7 of 35 slices shown, 9 images]
[im 5/35  brain]
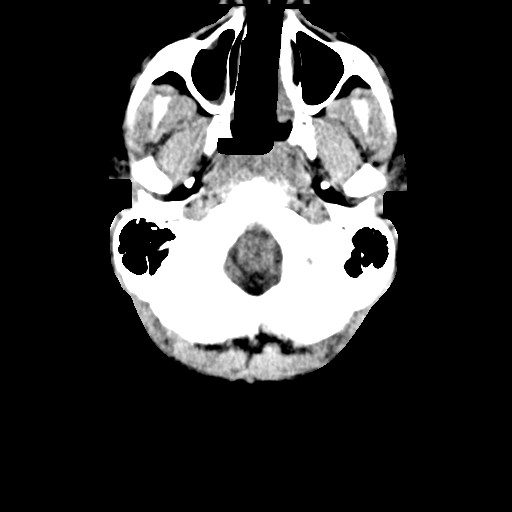
[im 5/35  bone]
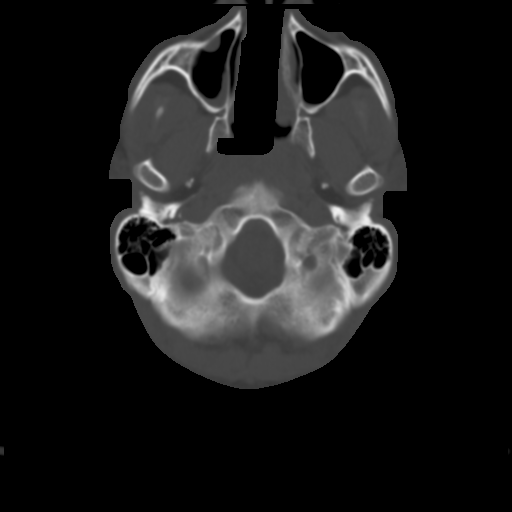
[im 9/35  brain]
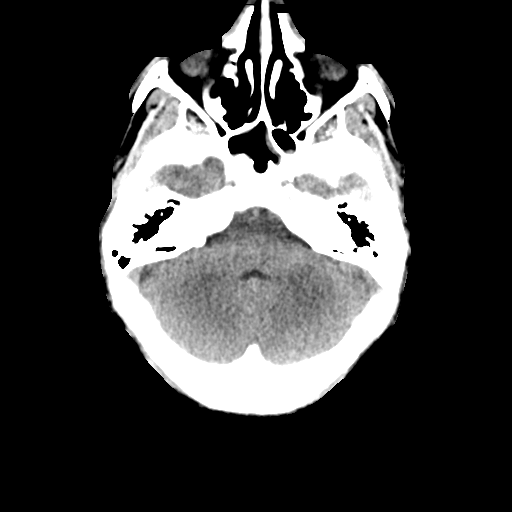
[im 13/35  brain]
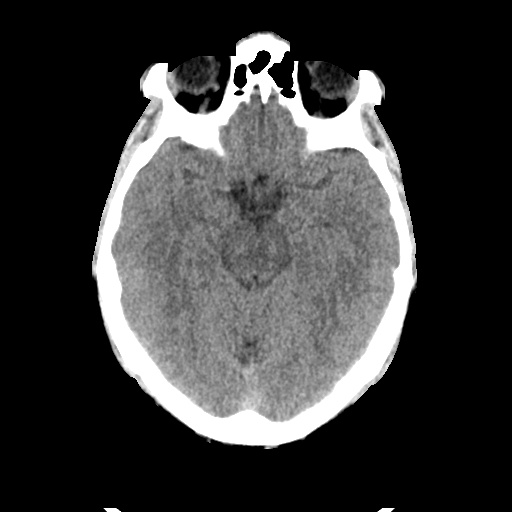
[im 18/35  brain]
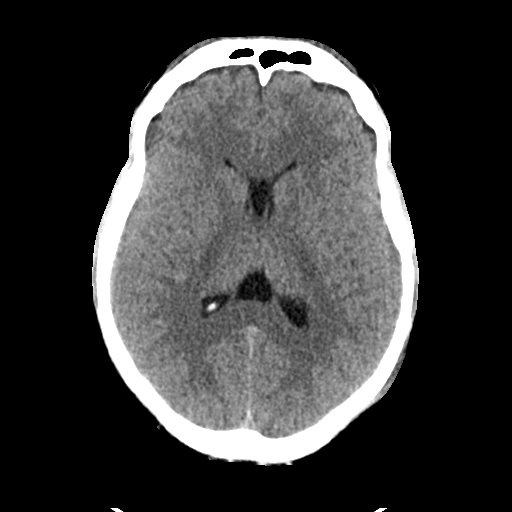
[im 22/35  brain]
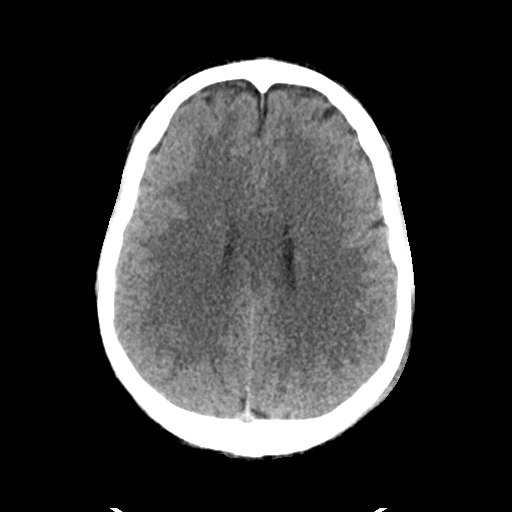
[im 22/35  bone]
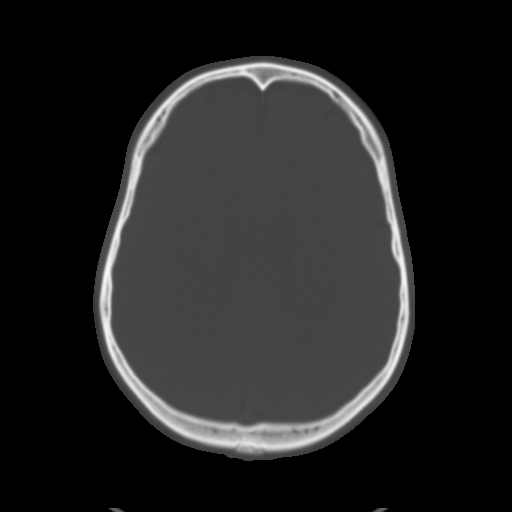
[im 26/35  brain]
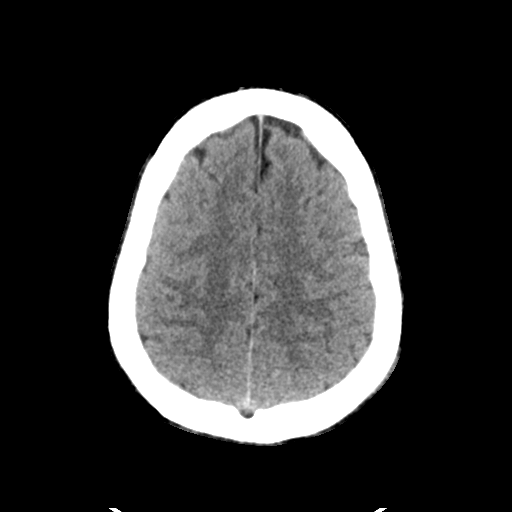
[im 30/35  brain]
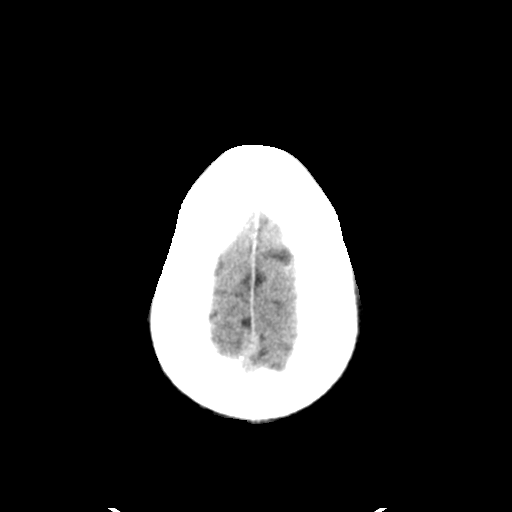

[Series 3: head bone · axial · 0.46mm/px · z∈[-156,-122]mm · 3 of 86 slices shown]
[im 9/86  bone]
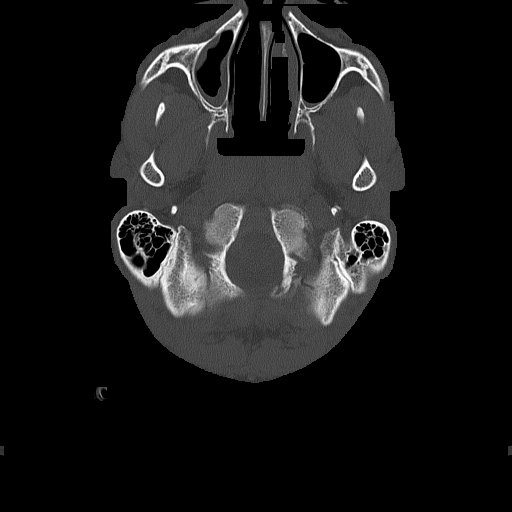
[im 18/86  bone]
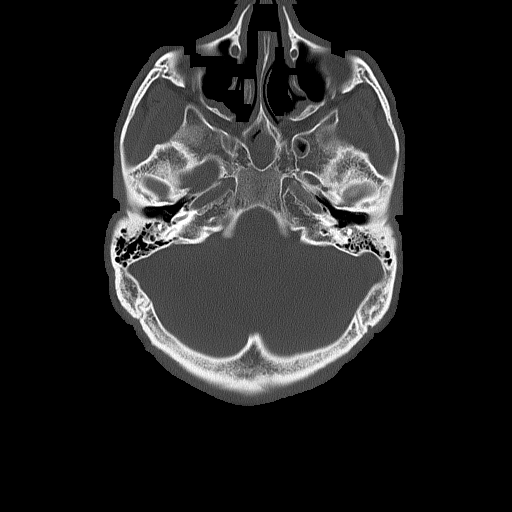
[im 26/86  bone]
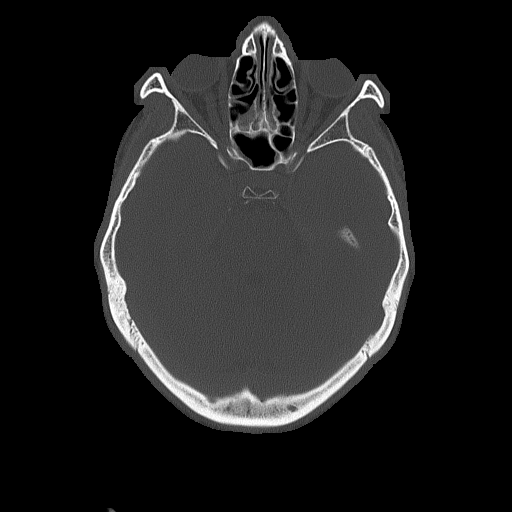

[Series 4: head without cor · coronal · non-contrast · 0.33mm/px · 3 of 70 slices shown]
[im 24/70  brain]
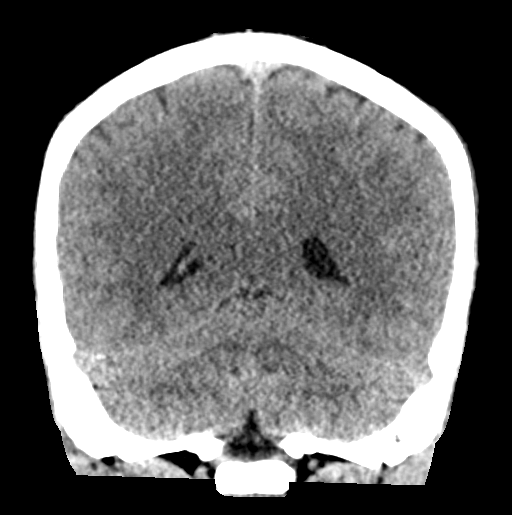
[im 31/70  brain]
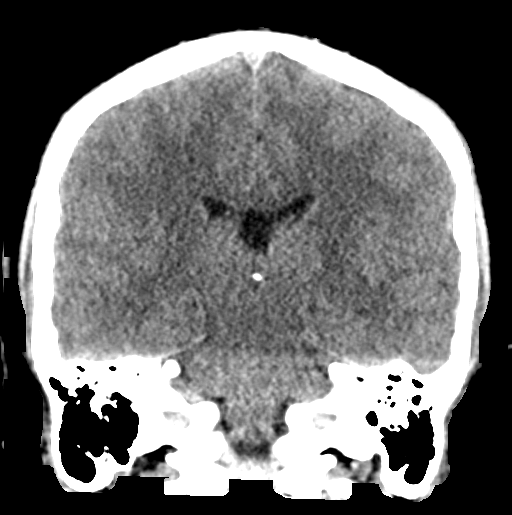
[im 39/70  brain]
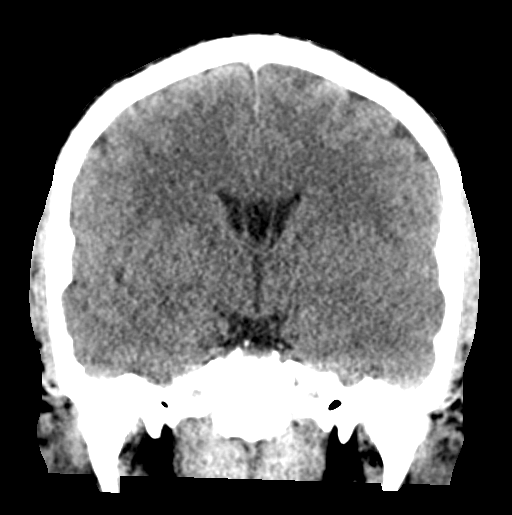

[Series 5: head without sag · sagittal · non-contrast · 0.33mm/px · 3 of 52 slices shown]
[im 18/52  brain]
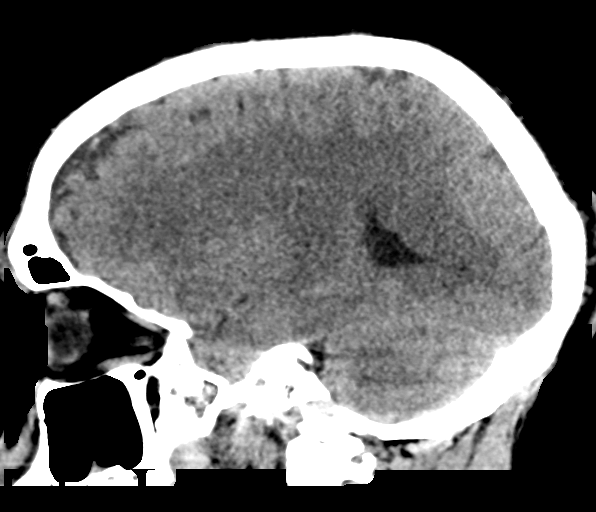
[im 26/52  brain]
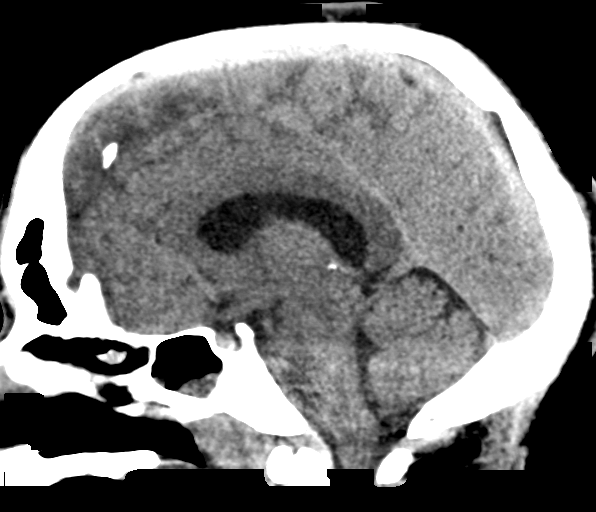
[im 35/52  brain]
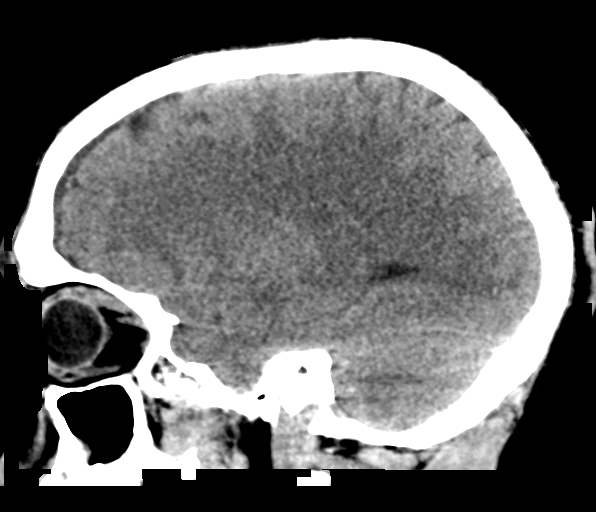

[16 of 47 positions shown; findings below may reference images not displayed]

FINDINGS: BRAIN: At least 3 subcentimeter LEFT and 1 RIGHT medial parietal
convexity intraparenchymal hemorrhages, possible adjacent
subcortical punctate hemorrhages. The ventricles and sulci are
normal, septum pellucidum is a normal variant. No intraparenchymal
mass effect nor midline shift. No acute large vascular territory
infarcts. No abnormal extra-axial fluid collections. Basal cisterns
are patent.

VASCULAR: Unremarkable.

SKULL/SOFT TISSUES: No skull fracture. Small LEFT parietal scalp
hematoma without subcutaneous gas or radiopaque foreign bodies.

ORBITS/SINUSES: Bilateral maxillary mucoperiosteal reaction
consistent with chronic sinusitis with mild paranasal sinus mucosal
thickening. Mastoid air cells are well aerated. Soft tissue within
the bilateral external auditory canals compatible with cerumen. The
mastoid aircells and included paranasal sinuses are well-aerated.

OTHER: Multiple RIGHT maxillary periapical lucency/abscess.
IMPRESSION: Multiple subcentimeter parietal intraparenchymal hemorrhagic
contusions/ diffuse axonal injury.

LEFT parietal scalp hematoma.  No skull fracture.
